# Patient Record
Sex: Male | Born: 1960 | Race: White | Hispanic: No | Smoking: Never smoker
Health system: Southern US, Community
[De-identification: ages and names within clinical notes are randomized; demographics above are authoritative.]

## PROBLEM LIST (undated history)

## (undated) DIAGNOSIS — C801 Malignant (primary) neoplasm, unspecified: Secondary | ICD-10-CM

## (undated) HISTORY — PX: HERNIA REPAIR: SHX51

## (undated) HISTORY — PX: FRACTURE SURGERY: SHX138

---

## 2015-12-27 ENCOUNTER — Ambulatory Visit (INDEPENDENT_AMBULATORY_CARE_PROVIDER_SITE_OTHER): Payer: Self-pay

## 2015-12-27 ENCOUNTER — Ambulatory Visit
Admission: EM | Admit: 2015-12-27 | Discharge: 2015-12-27 | Disposition: A | Discharge status: Home / Self Care | Payer: Self-pay | Attending: Family Medicine | Admitting: Family Medicine

## 2015-12-27 ENCOUNTER — Encounter: Payer: Self-pay | Admitting: *Deleted

## 2015-12-27 DIAGNOSIS — M79601 Pain in right arm: Secondary | ICD-10-CM

## 2015-12-27 DIAGNOSIS — T148XXA Other injury of unspecified body region, initial encounter: Secondary | ICD-10-CM

## 2015-12-27 DIAGNOSIS — T148 Other injury of unspecified body region: Secondary | ICD-10-CM

## 2015-12-27 DIAGNOSIS — M79604 Pain in right leg: Secondary | ICD-10-CM

## 2015-12-27 HISTORY — DX: Malignant (primary) neoplasm, unspecified: C80.1

## 2015-12-27 MED ORDER — MUPIROCIN 2 % EX OINT: 1.0000 "application " | TOPICAL_OINTMENT | Freq: Three times a day (TID) | CUTANEOUS | 0 refills | Status: AC
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
## 2015-12-27 NOTE — ED Triage Notes (Signed)
Patient was walking on his roof last PM and fell through injuring his right leg. Patient has a history of surgery on his right leg with the placement of titanium rods.

## 2017-10-04 IMAGING — CR DG TIBIA/FIBULA 2V*R*
4 series · 4 of 4 positions shown · non-contrast
Comparison: None.

CLINICAL DATA: Status post fall through a roof last night with a
right leg injury. Pain. Initial encounter.

EXAM:
RIGHT TIBIA AND FIBULA - 2 VIEW

[tibia ap (1 of 2)]
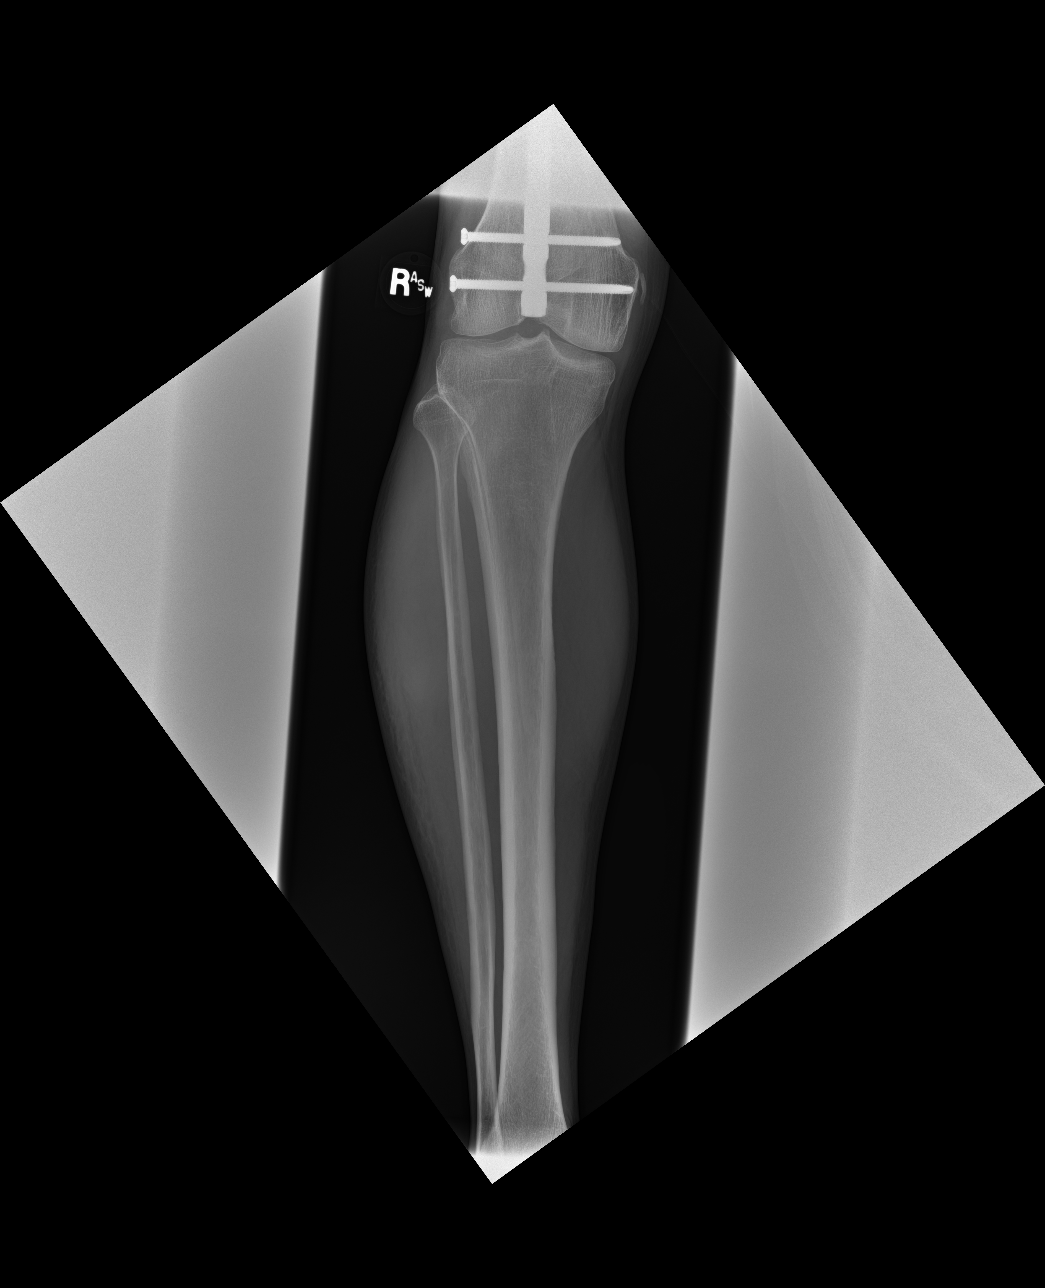

[tibia ap (2 of 2)]
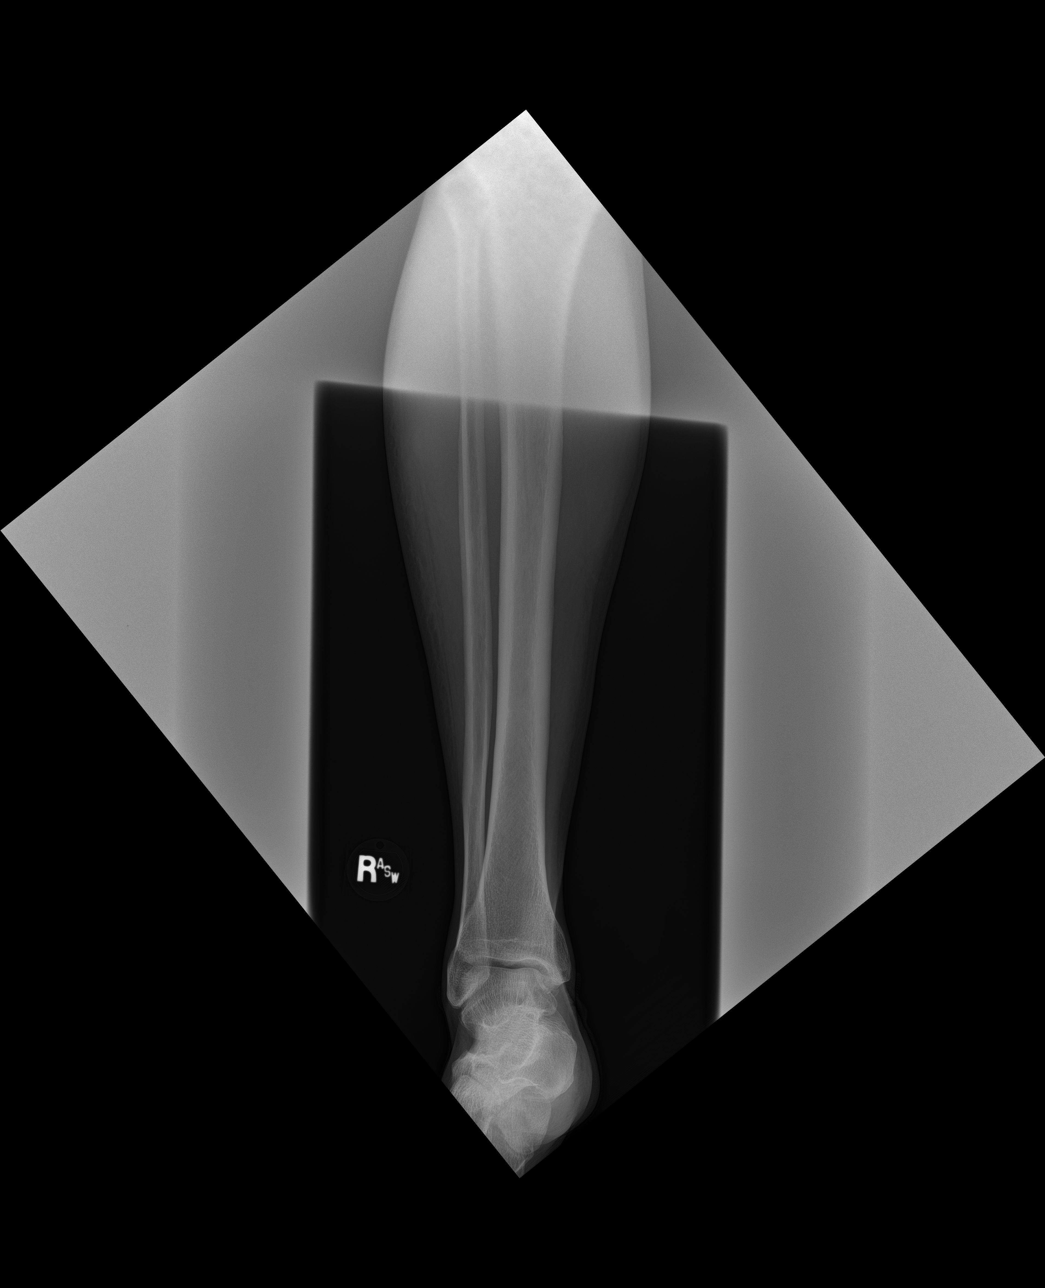

[tibia lat (1 of 2)]
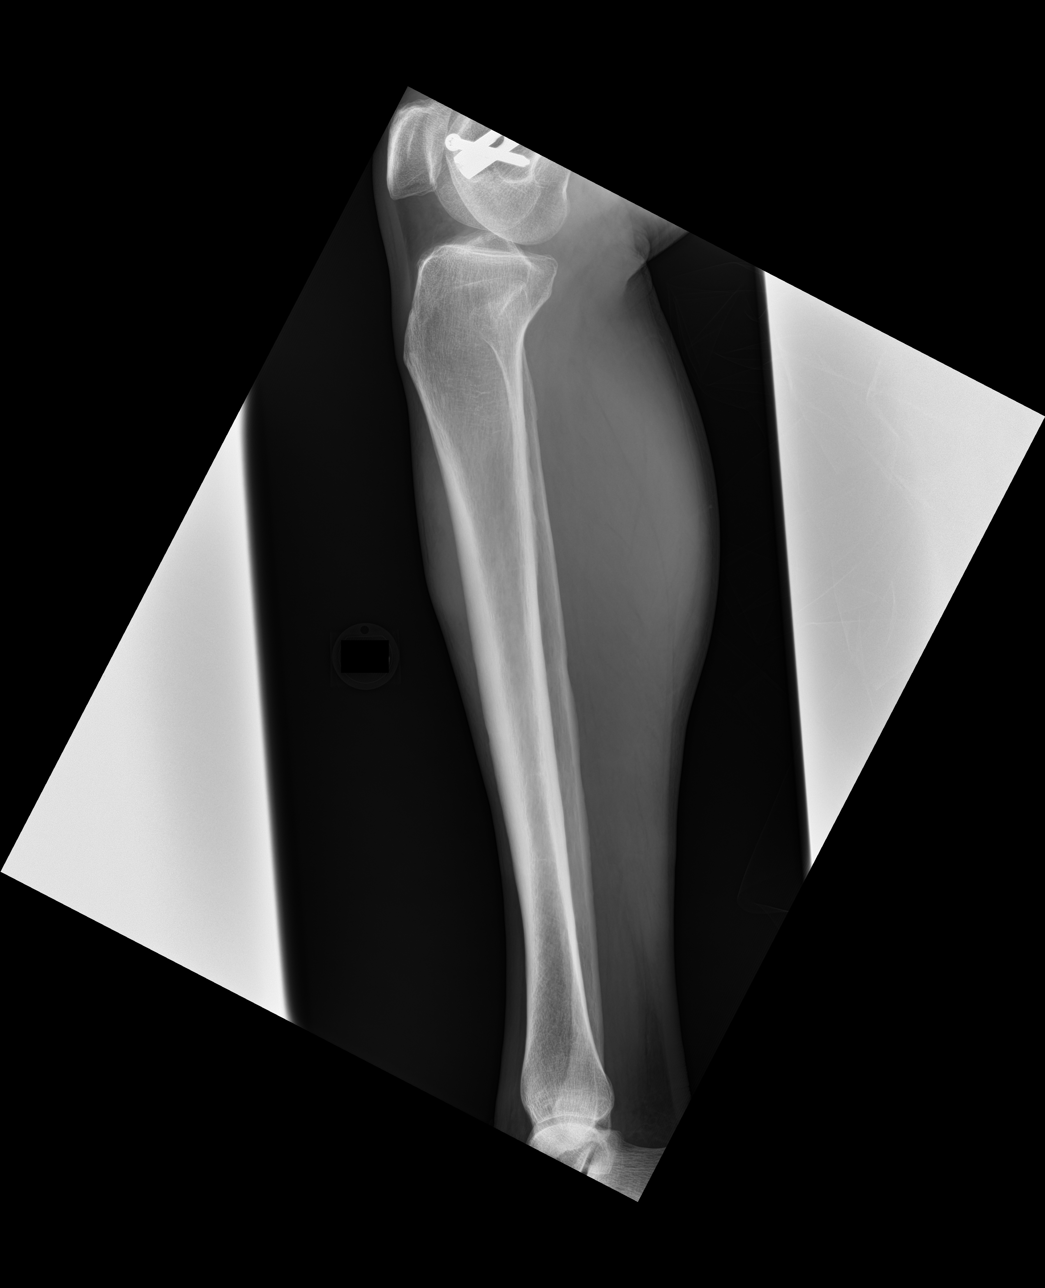

[tibia lat (2 of 2)]
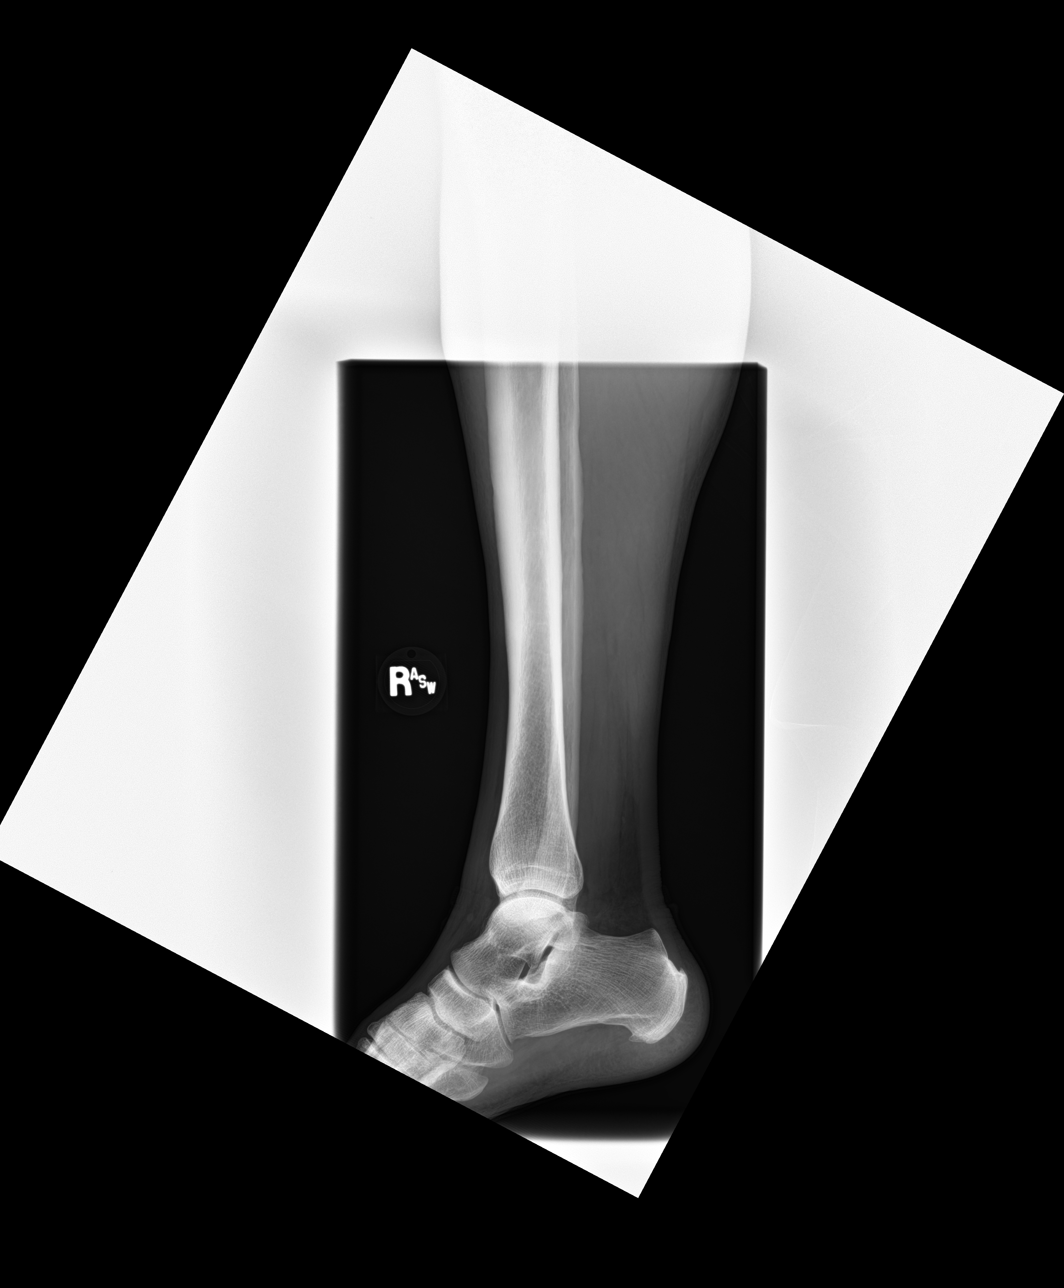

[4 of 4 positions shown; findings below may reference images not displayed]

FINDINGS: There is no evidence of fracture or other focal bone lesions. Soft
tissues are unremarkable. For findings regarding the right femur,
please see report of dedicated plain films of same day.
IMPRESSION: Negative exam.

## 2021-04-06 ENCOUNTER — Ambulatory Visit (INDEPENDENT_AMBULATORY_CARE_PROVIDER_SITE_OTHER)
Admit: 2021-04-06 | Discharge: 2021-04-06 | Disposition: A | Discharge status: Home / Self Care | Payer: Self-pay | Attending: Emergency Medicine | Admitting: Emergency Medicine

## 2021-04-06 ENCOUNTER — Ambulatory Visit
Admission: EM | Admit: 2021-04-06 | Discharge: 2021-04-06 | Disposition: A | Discharge status: Home / Self Care | Payer: Self-pay | Attending: Emergency Medicine | Admitting: Emergency Medicine

## 2021-04-06 ENCOUNTER — Other Ambulatory Visit: Payer: Self-pay

## 2021-04-06 DIAGNOSIS — N10 Acute tubulo-interstitial nephritis: Secondary | ICD-10-CM

## 2021-04-06 LAB — URINALYSIS, COMPLETE (UACMP) WITH MICROSCOPIC
Bilirubin Urine: NEGATIVE
Glucose, UA: NEGATIVE mg/dL
Ketones, ur: NEGATIVE mg/dL
Nitrite: NEGATIVE
Protein, ur: 30 mg/dL — AB
Specific Gravity, Urine: 1.015 (ref 1.005–1.030)
pH: 6 (ref 5.0–8.0)

## 2021-04-06 LAB — COMPREHENSIVE METABOLIC PANEL
ALT: 15 U/L (ref 0–44)
AST: 18 U/L (ref 15–41)
Albumin: 2.5 g/dL — ABNORMAL LOW (ref 3.5–5.0)
Alkaline Phosphatase: 143 U/L — ABNORMAL HIGH (ref 38–126)
Anion gap: 11 (ref 5–15)
BUN: 18 mg/dL (ref 6–20)
CO2: 24 mmol/L (ref 22–32)
Calcium: 7.9 mg/dL — ABNORMAL LOW (ref 8.9–10.3)
Chloride: 98 mmol/L (ref 98–111)
Creatinine, Ser: 1.24 mg/dL (ref 0.61–1.24)
GFR, Estimated: >60 mL/min (ref >60)
Glucose, Bld: 150 mg/dL — ABNORMAL HIGH (ref 70–99)
Potassium: 3.2 mmol/L — ABNORMAL LOW (ref 3.5–5.1)
Sodium: 133 mmol/L — ABNORMAL LOW (ref 135–145)
Total Bilirubin: 1.1 mg/dL (ref 0.3–1.2)
Total Protein: 7.1 g/dL (ref 6.5–8.1)

## 2021-04-06 LAB — CBC WITH DIFFERENTIAL/PLATELET
Abs Immature Granulocytes: 0.14 10*3/uL — ABNORMAL HIGH (ref 0.00–0.07)
Basophils Absolute: 0 10*3/uL (ref 0.0–0.1)
Basophils Relative: 0 %
Eosinophils Absolute: 0 10*3/uL (ref 0.0–0.5)
Eosinophils Relative: 0 %
HCT: 34.8 % — ABNORMAL LOW (ref 39.0–52.0)
Hemoglobin: 11.8 g/dL — ABNORMAL LOW (ref 13.0–17.0)
Immature Granulocytes: 1 %
Lymphocytes Relative: 9 %
Lymphs Abs: 1.1 10*3/uL (ref 0.7–4.0)
MCH: 32.8 pg (ref 26.0–34.0)
MCHC: 33.9 g/dL (ref 30.0–36.0)
MCV: 96.7 fL (ref 80.0–100.0)
Monocytes Absolute: 1 10*3/uL (ref 0.1–1.0)
Monocytes Relative: 8 %
Neutro Abs: 10.2 10*3/uL — ABNORMAL HIGH (ref 1.7–7.7)
Neutrophils Relative %: 82 %
Platelets: 410 10*3/uL — ABNORMAL HIGH (ref 150–400)
RBC: 3.6 MIL/uL — ABNORMAL LOW (ref 4.22–5.81)
RDW: 12.9 % (ref 11.5–15.5)
WBC: 12.5 10*3/uL — ABNORMAL HIGH (ref 4.0–10.5)
nRBC: 0 % (ref 0.0–0.2)

## 2021-04-06 MED ORDER — CEFTRIAXONE SODIUM 1 G IJ SOLR
1.0000 g | Freq: Once | INTRAMUSCULAR | Status: AC
  Administered 2021-04-06: 16:00:00 1 g via INTRAMUSCULAR

## 2021-04-06 MED ORDER — CIPROFLOXACIN HCL 500 MG PO TABS: 500.0000 mg | ORAL_TABLET | Freq: Two times a day (BID) | ORAL | 0 refills | Status: AC

## 2021-04-06 MED ORDER — IBUPROFEN 600 MG PO TABS: 600.0000 mg | ORAL_TABLET | Freq: Four times a day (QID) | ORAL | 0 refills | Status: AC | PRN

## 2021-04-06 NOTE — ED Triage Notes (Signed)
Pt here with C/O lower back kidney pain for 3 weeks, pt states he noticed an odor in urine 10 days ago. Pt states for 3 weeks has been running a fever and keeps motrin in his SX. States he stared feeling bad at thanksgiving.

## 2021-04-06 NOTE — ED Provider Notes (Signed)
HPI  SUBJECTIVE:  Henry Reid is a 60 y.o. male who presents with 10 days of dysuria, odorous urine, hematuria, nausea, vomiting, feeling feverish with chills, cold sweats, and body aches, nocturia.  he reports low midline abdominal pain and left back pain that radiates across his entire back.  He reports bilateral intermittent testicular "tenderness".  He denies testicular, scrotal swelling.  No urinary urgency, frequency, difficulty starting stream, dribbling, incomplete emptying of his bladder.  No pelvic, perineal pain.  No penile rash, discharge.  He is in a long-term monogamous relationship with a male, who is asymptomatic.  He has not been sexually active in 3 months.  STDs are not a concern today.  He has been taking ibuprofen 800 mg 3 times daily with improvement in his symptoms.  He has also tried increasing his fluid intake.  There are no aggravating factors.  He has a past medical history of colorectal cancer and is status post 7 surgeries, right inguinal hernia, status post multiple repairs.  No history of STDs, UTI, pyelonephritis, nephrolithiasis, prostatitis, BPH, chronic kidney disease, diabetes, hypertension.  PMD: At [facility].    Past Medical History:  Diagnosis Date   Cancer [facility])     Past Surgical History:  Procedure Laterality Date   FRACTURE SURGERY     HERNIA REPAIR Right    inguinal    History reviewed. No pertinent family history.  Social History   Tobacco Use   Smoking status: Never   Smokeless tobacco: Never  Substance Use Topics   Alcohol use: No   Drug use: No    No current facility-administered medications for this encounter.  Current Outpatient Medications:    ciprofloxacin (CIPRO) 500 MG tablet, Take 1 tablet (500 mg total) by mouth 2 (two) times daily for 14 days., Disp: 28 tablet, Rfl: 0   ibuprofen (ADVIL) 600 MG tablet, Take 1 tablet (600 mg total) by mouth every 6 (six) hours as needed., Disp: 30 tablet, Rfl: 0   omeprazole (PRILOSEC OTC) 20  MG tablet, Take 20 mg by mouth daily., Disp: , Rfl:    mupirocin ointment (BACTROBAN) 2 %, Apply 1 application topically 3 (three) times daily., Disp: 22 g, Rfl: 0  Allergies  Allergen Reactions   Penicillins Hives     ROS  As noted in HPI.   Physical Exam  BP 115/75 (BP Location: Left Arm)    Pulse 63    Temp 98.3 F (36.8 C) (Oral)    Resp 18    Ht 6' (1.829 m)    Wt 77.1 kg    SpO2 98%    BMI 23.06 kg/m   Constitutional: Well developed, well nourished, no acute distress Eyes:  EOMI, conjunctiva normal bilaterally HENT: Normocephalic, atraumatic,mucus membranes moist Respiratory: Normal inspiratory effort Cardiovascular: Normal rate GI: nondistended soft.  Positive suprapubic, flank tenderness. Back: Positive bilateral CVAT GU: Declined Rectal: Declined skin: No rash, skin intact Musculoskeletal: no deformities Neurologic: Alert & oriented x 3, no focal neuro deficits Psychiatric: Speech and behavior appropriate   ED Course   Medications  cefTRIAXone (ROCEPHIN) injection 1 g (1 g Intramuscular Given 04/06/21 1615)    Orders Placed This Encounter  Procedures   Urine Culture    Standing Status:   Standing    Number of Occurrences:   1    Order Specific Question:   Indication    Answer:   Dysuria   CT Renal Stone Study    Standing Status:   Standing    Number  of Occurrences:   1    Order Specific Question:   Radiology Contrast Protocol - do NOT remove file path    Answer:   [email or web address]   Urinalysis, Complete w Microscopic    Standing Status:   Standing    Number of Occurrences:   1   Comprehensive metabolic panel    Standing Status:   Standing    Number of Occurrences:   1   CBC with Differential    Standing Status:   Standing    Number of Occurrences:   1    Results for orders placed or performed during the hospital encounter of 04/06/21 (from the past 24 hour(s))  Urinalysis, Complete w Microscopic      Status: Abnormal   Collection Time: 04/06/21  2:13 PM  Result Value Ref Range   Color, Urine YELLOW YELLOW   APPearance CLOUDY (A) CLEAR   Specific Gravity, Urine 1.015 1.005 - 1.030   pH 6.0 5.0 - 8.0   Glucose, UA NEGATIVE NEGATIVE mg/dL   Hgb urine dipstick SMALL (A) NEGATIVE   Bilirubin Urine NEGATIVE NEGATIVE   Ketones, ur NEGATIVE NEGATIVE mg/dL   Protein, ur 30 (A) NEGATIVE mg/dL   Nitrite NEGATIVE NEGATIVE   Leukocytes,Ua LARGE (A) NEGATIVE   Squamous Epithelial / LPF 0-5 0 - 5   WBC, UA 21-50 0 - 5 WBC/hpf   RBC / HPF 6-10 0 - 5 RBC/hpf   Bacteria, UA MANY (A) NONE SEEN  Comprehensive metabolic panel     Status: Abnormal   Collection Time: 04/06/21  4:15 PM  Result Value Ref Range   Sodium 133 (L) 135 - 145 mmol/L   Potassium 3.2 (L) 3.5 - 5.1 mmol/L   Chloride 98 98 - 111 mmol/L   CO2 24 22 - 32 mmol/L   Glucose, Bld 150 (H) 70 - 99 mg/dL   BUN 18 6 - 20 mg/dL   Creatinine, Ser 1.24 0.61 - 1.24 mg/dL   Calcium 7.9 (L) 8.9 - 10.3 mg/dL   Total Protein 7.1 6.5 - 8.1 g/dL   Albumin 2.5 (L) 3.5 - 5.0 g/dL   AST 18 15 - 41 U/L   ALT 15 0 - 44 U/L   Alkaline Phosphatase 143 (H) 38 - 126 U/L   Total Bilirubin 1.1 0.3 - 1.2 mg/dL   GFR, Estimated >60 >60 mL/min   Anion gap 11 5 - 15  CBC with Differential     Status: Abnormal   Collection Time: 04/06/21  4:15 PM  Result Value Ref Range   WBC 12.5 (H) 4.0 - 10.5 K/uL   RBC 3.60 (L) 4.22 - 5.81 MIL/uL   Hemoglobin 11.8 (L) 13.0 - 17.0 g/dL   HCT 34.8 (L) 39.0 - 52.0 %   MCV 96.7 80.0 - 100.0 fL   MCH 32.8 26.0 - 34.0 pg   MCHC 33.9 30.0 - 36.0 g/dL   RDW 12.9 11.5 - 15.5 %   Platelets 410 (H) 150 - 400 K/uL   nRBC 0.0 0.0 - 0.2 %   Neutrophils Relative % 82 %   Neutro Abs 10.2 (H) 1.7 - 7.7 K/uL   Lymphocytes Relative 9 %   Lymphs Abs 1.1 0.7 - 4.0 K/uL   Monocytes Relative 8 %   Monocytes Absolute 1.0 0.1 - 1.0 K/uL   Eosinophils Relative 0 %   Eosinophils Absolute 0.0 0.0 - 0.5 K/uL   Basophils Relative 0 %    Basophils Absolute 0.0 0.0 - 0.1 K/uL  Immature Granulocytes 1 %   Abs Immature Granulocytes 0.14 (H) 0.00 - 0.07 K/uL   CT Renal Stone Study  Result Date: 04/06/2021 CLINICAL DATA:  Lower back pain and kidney pain for 3 weeks, and odor to urine for 10 days, fever for 3 weeks, started feeling bad at Thanksgiving, history rectal cancer EXAM: CT ABDOMEN AND PELVIS WITHOUT CONTRAST TECHNIQUE: Multidetector CT imaging of the abdomen and pelvis was performed following the standard protocol without IV contrast. No oral contrast administered. Sagittal and coronal MPR images reconstructed from axial data set. COMPARISON:  None FINDINGS: Lower chest: Lung bases clear Hepatobiliary: Gallbladder contains several tiny dependent calculi. Liver normal appearance. Pancreas: Normal appearance Spleen: Spleen normal appearance.  2.2 cm splenule medial to spleen Adrenals/Urinary Tract: Adrenal glands normal appearance. Mild perinephric edema bilaterally, nonspecific but can be seen with urinary tract infection. No renal mass or hydronephrosis. No urinary tract calcification or ureteral dilatation. Bladder unremarkable for degree of distension. Stomach/Bowel: Prior rectal surgery. Prior small bowel anastomosis. Normal appendix. Mild rectal wall thickening versus artifact from underdistention. Stomach and bowel loops otherwise normal appearance. Vascular/Lymphatic: Aorta normal caliber. No adenopathy. Scattered normal sized retroperitoneal nodes. Reproductive: Unremarkable prostate gland and seminal vesicles Other: Diffuse infiltrative changes of the presacral space, may be related to prior rectal cancer therapy/surgery/radiation therapy, recommend correlation with patient history and any prior imaging patient may have to confirm stability. No free air or free fluid. RIGHT inguinal hernia containing a small amount of fluid fluid and a nonobstructed bowel loop. Musculoskeletal: Osseous demineralization with old appearing  compression deformity of L4 vertebral body. Retrolisthesis L5-S1. Mild discogenic changes of S1 segment of sacrum as well as an S1 sacral bone island. IMPRESSION: Mild perinephric edema bilaterally, nonspecific but can be seen with urinary tract infection, recommend correlation with urinalysis. Cholelithiasis. RIGHT inguinal hernia containing a small amount of fluid fluid and a nonobstructed bowel loop. Rectal wall thickening with diffuse infiltrative changes of the presacral space, may be related to prior rectal cancer therapy/surgery/radiation therapy, recommend correlation with patient history and any prior imaging patient may have to confirm stability and exclude recurrent tumor. Old appearing compression deformity of L4 vertebral body. Electronically Signed   By: Lavonia Dana M.D.   On: 04/06/2021 16:41    ED Clinical Impression  1. Acute pyelonephritis      ED Assessment/Plan  Concern for pyelonephritis, infected obstructing stone.  Also in the differential is prostatitis, I had multiple discussions with the patient about the importance of doing a digital rectal exam, as prostatitis is treated differently than urinary tract infections, however patient declined exam each time.  Discussed that prostatitis could very likely be causing his symptoms, and that it is more common than pyelonephritis.  Checking CBC, CMP, renal stone study.  Giving 1 g of Rocephin.  Has a mild leukocytosis with a left shift and some elevated platelets.  His urine has hematuria, large leukocytes and many bacteria, pyuria, which is suggestive of a urinary tract infection.  He is slightly hypokalemic, his sugar is slightly elevated, but this is not a fasting glucose.  His alk phos is also slightly elevated, however, there does not appear to be any liver damage.  His AST/ALT are normal.  Reviewed imaging independently.  Mild perinephric edema bilaterally, which can be seen with UTI.  Rectal wall thickening with diffuse  infiltrative changes in the presacral space, radiology recommends correlation with patient history and any prior imaging to confirm stability and exclude recurrent tumor.  See  radiology report for full details.  Concern for pyelonephritis, although he has some other CT findings that are concerning.  Will send home with Cipro 500 mg p.o. twice daily for 14 days, which will also cover prostatitis.  Advised follow-up with his oncologist at [facility] for comparison of prior imaging to confirm stability and exclude recurrent tumor.  Released scans to [facility] system.  Discussed labs, imaging, MDM, treatment plan, and plan for follow-up with patient. Discussed sn/sx that should prompt return to the ED. patient agrees with plan.   Meds ordered this encounter  Medications   cefTRIAXone (ROCEPHIN) injection 1 g   ciprofloxacin (CIPRO) 500 MG tablet    Sig: Take 1 tablet (500 mg total) by mouth 2 (two) times daily for 14 days.    Dispense:  28 tablet    Refill:  0   ibuprofen (ADVIL) 600 MG tablet    Sig: Take 1 tablet (600 mg total) by mouth every 6 (six) hours as needed.    Dispense:  30 tablet    Refill:  0      *This clinic note was created using [employer]. Therefore, there may be occasional mistakes despite careful proofreading.  ?    Melynda Ripple, MD 04/06/21 2022

## 2021-04-06 NOTE — Discharge Instructions (Addendum)
Your presentation is most consistent with an kidney infection. you do not have a kidney stone.  However, your CT found some rectal wall thickening with diffuse infiltrative changes.  The radiologist is not sure if this is new or if this is from your previous rectal cancer surgery/therapy.  They recommend that you follow-up with your oncologist at [facility], and have these images reviewed, to make sure that these are stable changes and that the cancer has not come back.  I am putting you on Cipro for 2 weeks, which will also cover a prostate infection in addition to a kidney infection.  You may take 600 mg of ibuprofen combined with 1000 mg of Tylenol together 3-4 times a day as needed for pain and fever.  Make sure you continue drinking plenty of extra fluids.  Go immediately to the ED for persistent fevers, worsening pain, if you start to feel worse, or for any other concerns.  We will contact you if we need to change your antibiotics.

## 2021-04-09 LAB — URINE CULTURE: Culture: >=100000 — AB
